# Patient Record
Sex: Female | Born: 1995 | Hispanic: No | Marital: Single | Smoking: Never smoker
Health system: Southern US, Community
[De-identification: ages and names within clinical notes are randomized; demographics above are authoritative.]

## PROBLEM LIST (undated history)

## (undated) DIAGNOSIS — R011 Cardiac murmur, unspecified: Secondary | ICD-10-CM

## (undated) DIAGNOSIS — Q210 Ventricular septal defect: Secondary | ICD-10-CM

## (undated) HISTORY — DX: Cardiac murmur, unspecified: R01.1

## (undated) HISTORY — DX: Ventricular septal defect: Q21.0

---
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
## 2015-01-21 IMAGING — CR DG KNEE COMPLETE 4+V*L*
5 series · 5 of 5 positions shown · non-contrast
Comparison: None.

CLINICAL DATA: Knee pain and recent fall.

EXAM:
LEFT KNEE - COMPLETE 4+ VIEW

[AP]
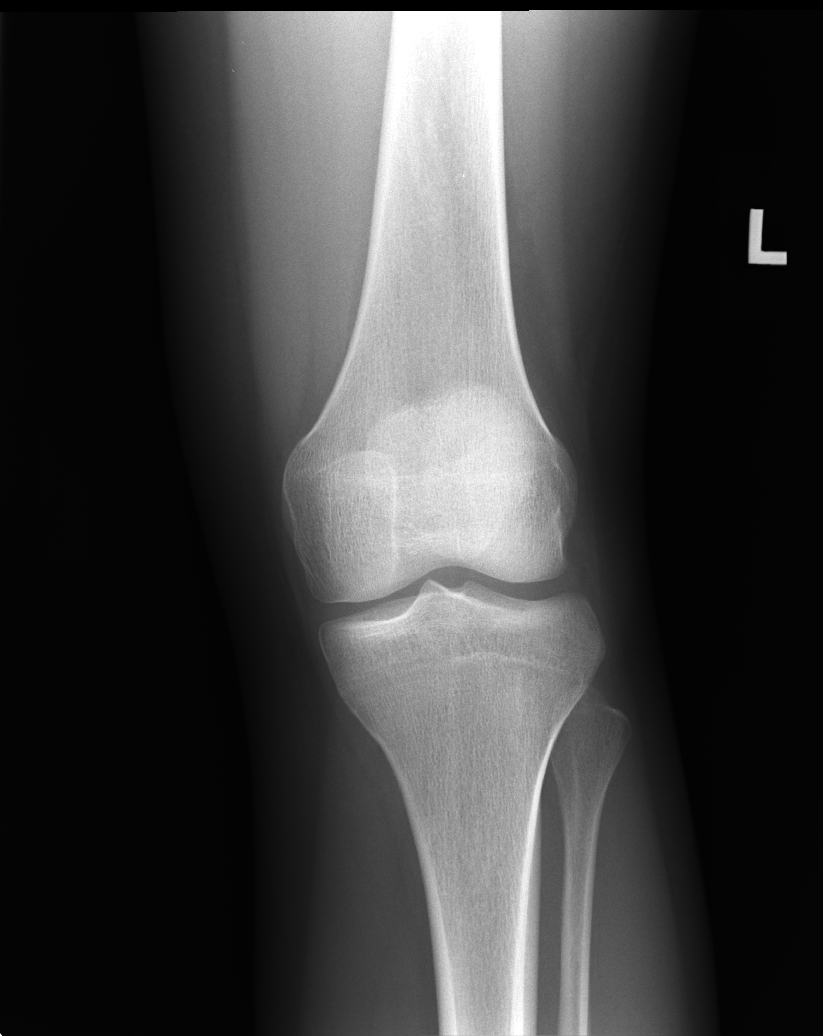

[ap int rot]
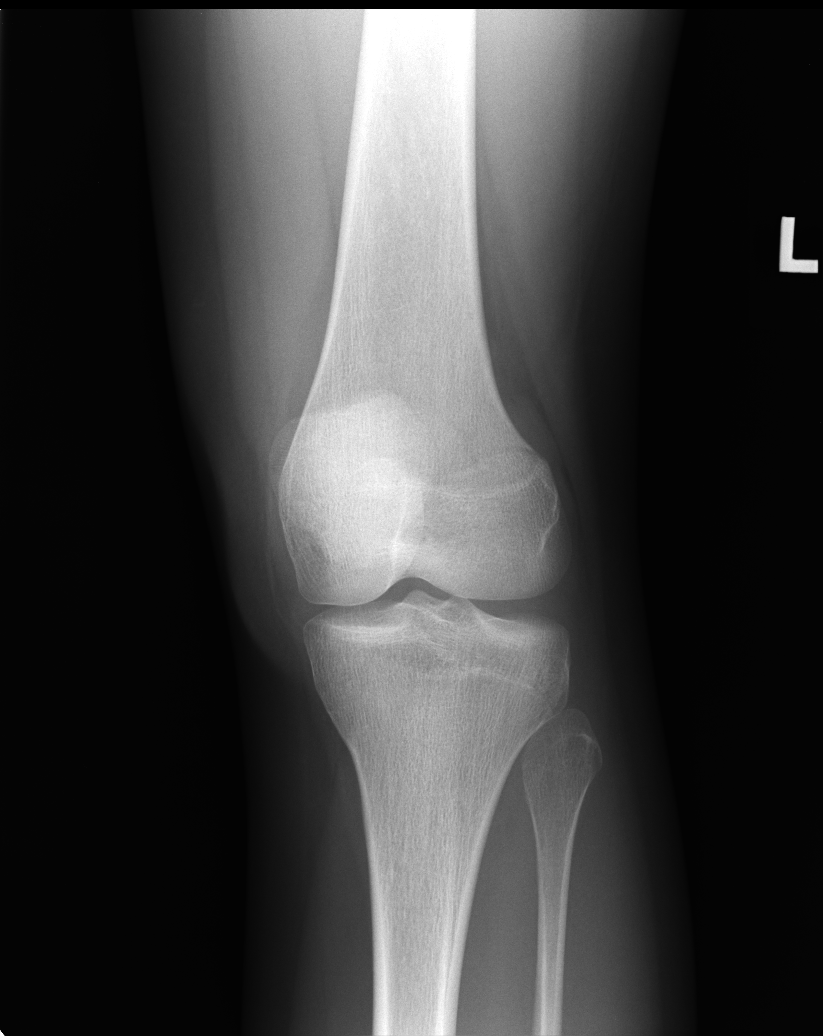

[ap ext rot]
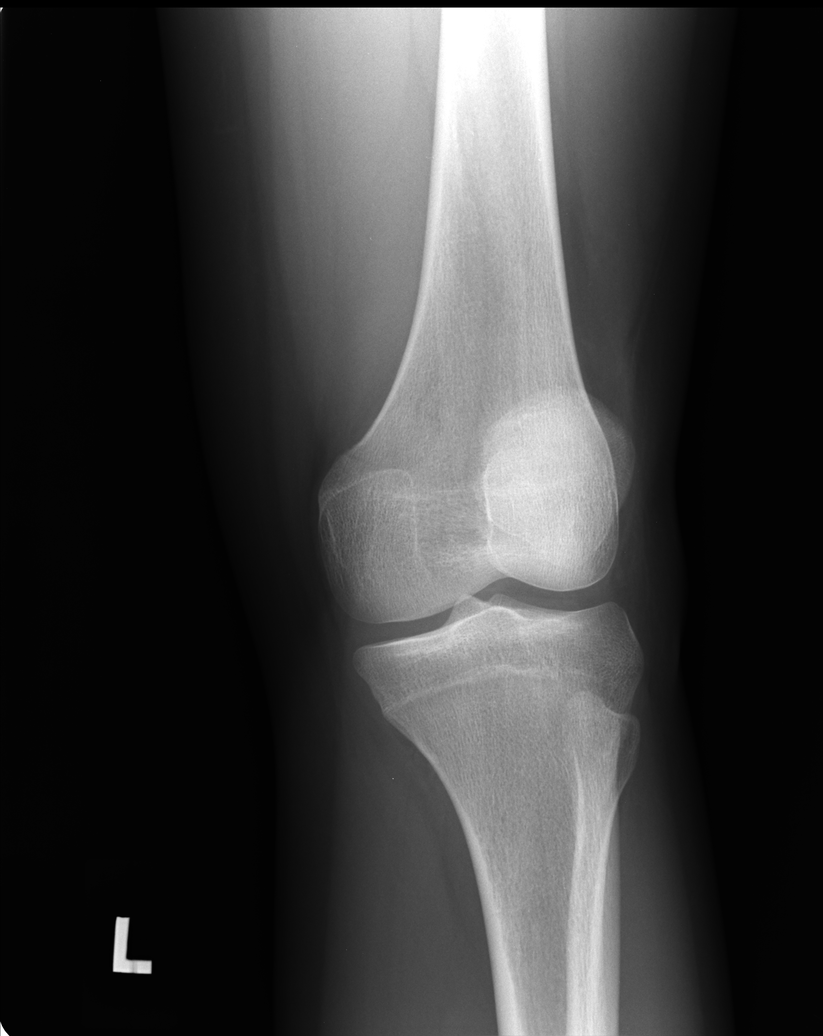

[lateral]
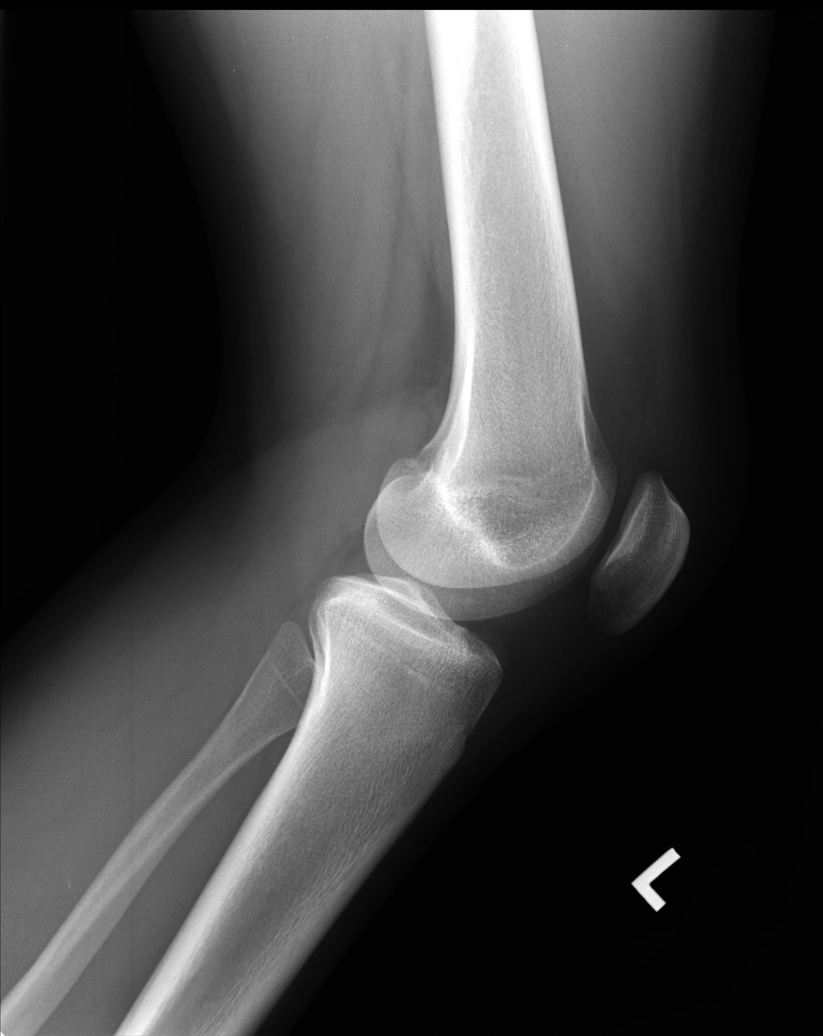

[sunrise]
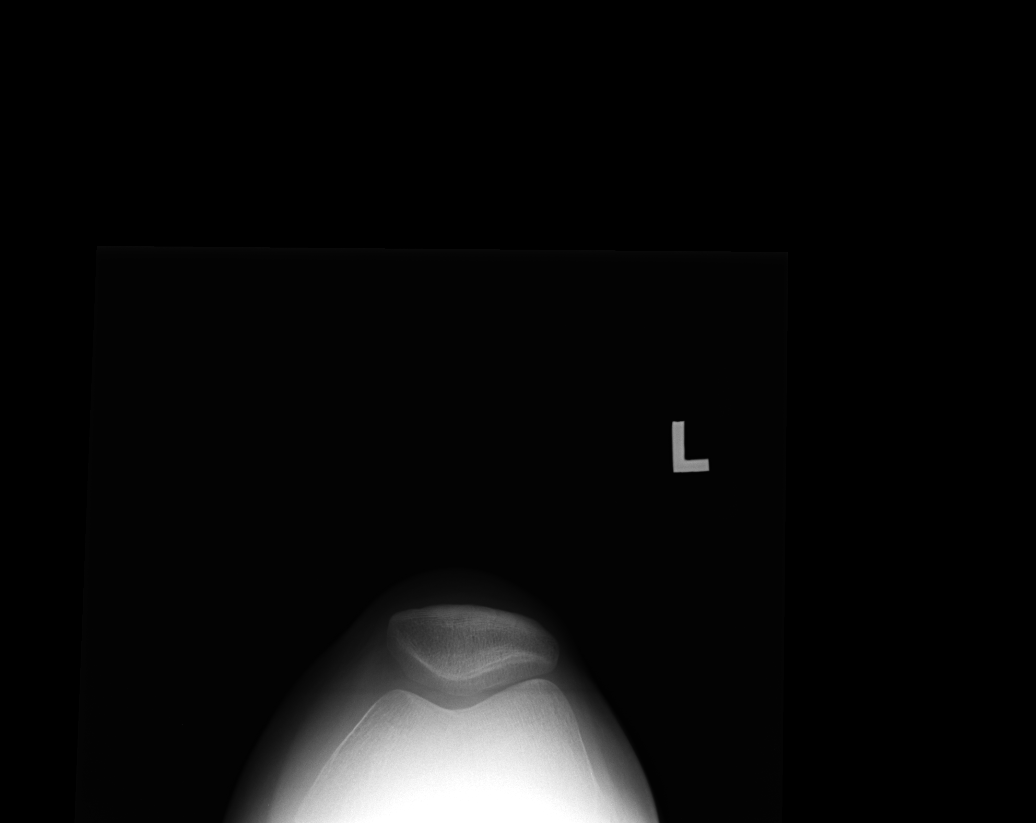

[5 of 5 positions shown; findings below may reference images not displayed]

FINDINGS: There is no evidence of fracture, dislocation, or joint effusion.
There is no evidence of arthropathy or other focal bone abnormality.
Soft tissues are unremarkable.
IMPRESSION: Negative.

## 2015-04-15 ENCOUNTER — Encounter: Payer: Self-pay | Admitting: Family Medicine

## 2015-04-20 ENCOUNTER — Encounter: Payer: Self-pay | Admitting: Family Medicine
